# Patient Record
Sex: Male | Born: 1995 | Marital: Single | Smoking: Never smoker
Health system: Southern US, Community
[De-identification: ages and names within clinical notes are randomized; demographics above are authoritative.]

## PROBLEM LIST (undated history)

## (undated) DIAGNOSIS — Z789 Other specified health status: Secondary | ICD-10-CM

## (undated) HISTORY — PX: COLONOSCOPY: SHX174

---

## 2016-03-27 ENCOUNTER — Ambulatory Visit (INDEPENDENT_AMBULATORY_CARE_PROVIDER_SITE_OTHER): Payer: Self-pay | Admitting: Podiatry

## 2016-03-27 ENCOUNTER — Encounter: Payer: Self-pay | Admitting: Podiatry

## 2016-03-27 VITALS — BP 129/95 | HR 54 | Resp 16 | Ht 69.0 in | Wt 290.0 lb

## 2016-03-27 DIAGNOSIS — L600 Ingrowing nail: Secondary | ICD-10-CM

## 2016-03-27 MED ORDER — NEOMYCIN-POLYMYXIN-HC 3.5-10000-1 OT SOLN: OTIC | 0 refills | Status: DC

## 2016-03-27 NOTE — Progress Notes (Signed)
   Subjective:    Patient ID: William Flynn, male    DOB: 1995/07/19, 21 y.o.   MRN: [id number]  HPI: [name] presents today with chief complaint of painful ingrown toenails to the fibular borders of the hallux bilateral times the past 2 months. He is a Leisure centre managermath student at [employer]    Review of Systems  All other systems reviewed and are negative.      Objective:   Physical Exam: Also stable alert and oriented 3. Pulses are palpable. Neurologic sensorium is intact. Degenerative flexors are intact muscle strength is normal bilateral. Orthopedic evaluation of strength all joints distal to the ankle for range of motion without crepitation. Cutaneous evaluation of his joints supple well-hydrated cutis no erythema edema cellulitis drainage or odor. Sharp innervated nail margins along the fibular borders.        Assessment & Plan:  Ingrown nail paronychia fibular border hallux bilateral.  Plan: Chemical matrixectomy performed today to the fibular border of the hallux bilateral after local anesthesia was achieved. He tolerated the procedure well without complications. He tolerated procedure well without complications he was provided with both oral and written home going instructions for care and soaking of his toe also provided him with a prescription for Cortisporin Otic to be applied twice daily after soaking. I will follow-up with him in 2 weeks.

## 2016-03-27 NOTE — Patient Instructions (Signed)

## 2016-04-17 ENCOUNTER — Ambulatory Visit (INDEPENDENT_AMBULATORY_CARE_PROVIDER_SITE_OTHER): Payer: Self-pay | Admitting: Podiatry

## 2016-04-17 DIAGNOSIS — L600 Ingrowing nail: Secondary | ICD-10-CM

## 2016-04-17 NOTE — Progress Notes (Signed)
He presents today for follow-up of his matrixectomy is hallux bilateral. He states that 2-3 days after the procedure they were exquisitely painful but now they're doing much better. He states they feel much better now than he did prior to surgery. He states that he has not been soaking over the last few days.  Objective: Vital signs are stable he is alert and oriented 3 there's some mild drainage with some mild erythema proximal lateral nail fold of the hallux left and hallux right appears to be healing uneventfully.  Assessment: Mild postoperative paronychia hallux left.  Plan: I encouraged him to soak with Epsom salts and warm water twice daily covered in the daytime on Levaquin at bedtime. I will follow-up with him should these worsen. He is to continue to soak until completely resolved and that there is no redness no drainage and no pain on palpation.

## 2017-04-15 ENCOUNTER — Encounter (HOSPITAL_COMMUNITY): Payer: Self-pay

## 2017-04-15 ENCOUNTER — Emergency Department (HOSPITAL_COMMUNITY): Payer: BLUE CROSS/BLUE SHIELD

## 2017-04-15 ENCOUNTER — Encounter (HOSPITAL_COMMUNITY)
Admission: EM | Disposition: A | Discharge status: Home / Self Care | Payer: Self-pay | Source: Home / Self Care | Attending: Emergency Medicine

## 2017-04-15 ENCOUNTER — Other Ambulatory Visit: Payer: Self-pay

## 2017-04-15 ENCOUNTER — Emergency Department (HOSPITAL_COMMUNITY): Payer: BLUE CROSS/BLUE SHIELD | Admitting: Certified Registered"

## 2017-04-15 ENCOUNTER — Observation Stay (HOSPITAL_COMMUNITY)
Admission: EM | Admit: 2017-04-15 | Discharge: 2017-04-16 | Disposition: A | Discharge status: Home / Self Care | Payer: BLUE CROSS/BLUE SHIELD | Attending: General Surgery | Admitting: General Surgery

## 2017-04-15 DIAGNOSIS — K3533 Acute appendicitis with perforation and localized peritonitis, with abscess: Secondary | ICD-10-CM | POA: Diagnosis not present

## 2017-04-15 DIAGNOSIS — Z9049 Acquired absence of other specified parts of digestive tract: Secondary | ICD-10-CM

## 2017-04-15 DIAGNOSIS — K760 Fatty (change of) liver, not elsewhere classified: Secondary | ICD-10-CM | POA: Insufficient documentation

## 2017-04-15 DIAGNOSIS — Z6841 Body Mass Index (BMI) 40.0 and over, adult: Secondary | ICD-10-CM | POA: Diagnosis not present

## 2017-04-15 DIAGNOSIS — K3580 Unspecified acute appendicitis: Secondary | ICD-10-CM

## 2017-04-15 DIAGNOSIS — R7981 Abnormal blood-gas level: Secondary | ICD-10-CM

## 2017-04-15 HISTORY — PX: APPENDECTOMY: SHX54

## 2017-04-15 HISTORY — DX: Other specified health status: Z78.9

## 2017-04-15 HISTORY — PX: LAPAROSCOPIC APPENDECTOMY: SHX408

## 2017-04-15 LAB — URINALYSIS, ROUTINE W REFLEX MICROSCOPIC
BILIRUBIN URINE: NEGATIVE
Bacteria, UA: NONE SEEN
GLUCOSE, UA: NEGATIVE mg/dL
KETONES UR: NEGATIVE mg/dL
LEUKOCYTES UA: NEGATIVE
NITRITE: NEGATIVE
PROTEIN: 30 mg/dL — AB
SQUAMOUS EPITHELIAL / LPF: NONE SEEN
Specific Gravity, Urine: >1.046 — ABNORMAL HIGH (ref 1.005–1.030)
pH: 6 (ref 5.0–8.0)

## 2017-04-15 LAB — COMPREHENSIVE METABOLIC PANEL
ALT: 43 U/L (ref 17–63)
ANION GAP: 14 (ref 5–15)
AST: 16 U/L (ref 15–41)
Albumin: 4.4 g/dL (ref 3.5–5.0)
Alkaline Phosphatase: 56 U/L (ref 38–126)
BILIRUBIN TOTAL: 3.6 mg/dL — AB (ref 0.3–1.2)
BUN: 12 mg/dL (ref 6–20)
CO2: 25 mmol/L (ref 22–32)
Calcium: 9.3 mg/dL (ref 8.9–10.3)
Chloride: 98 mmol/L — ABNORMAL LOW (ref 101–111)
Creatinine, Ser: 1.34 mg/dL — ABNORMAL HIGH (ref 0.61–1.24)
GFR calc Af Amer: >60 mL/min (ref >60)
Glucose, Bld: 126 mg/dL — ABNORMAL HIGH (ref 65–99)
Potassium: 3.3 mmol/L — ABNORMAL LOW (ref 3.5–5.1)
Sodium: 137 mmol/L (ref 135–145)
TOTAL PROTEIN: 8 g/dL (ref 6.5–8.1)

## 2017-04-15 LAB — CBC WITH DIFFERENTIAL/PLATELET
BASOS ABS: 0.1 10*3/uL (ref 0.0–0.1)
Basophils Relative: 0 %
EOS PCT: 0 %
Eosinophils Absolute: 0.1 10*3/uL (ref 0.0–0.7)
HCT: 46.1 % (ref 39.0–52.0)
Hemoglobin: 16.6 g/dL (ref 13.0–17.0)
LYMPHS PCT: 16 %
Lymphs Abs: 2.9 10*3/uL (ref 0.7–4.0)
MCH: 33.6 pg (ref 26.0–34.0)
MCHC: 36 g/dL (ref 30.0–36.0)
MCV: 93.3 fL (ref 78.0–100.0)
Monocytes Absolute: 1.5 10*3/uL — ABNORMAL HIGH (ref 0.1–1.0)
Monocytes Relative: 8 %
Neutro Abs: 14.2 10*3/uL — ABNORMAL HIGH (ref 1.7–7.7)
Neutrophils Relative %: 76 %
PLATELETS: 217 10*3/uL (ref 150–400)
RBC: 4.94 MIL/uL (ref 4.22–5.81)
RDW: 12.5 % (ref 11.5–15.5)
WBC: 18.7 10*3/uL — AB (ref 4.0–10.5)

## 2017-04-15 LAB — LIPASE, BLOOD: LIPASE: 40 U/L (ref 11–51)

## 2017-04-15 LAB — I-STAT CG4 LACTIC ACID, ED: Lactic Acid, Venous: 1.74 mmol/L (ref 0.5–1.9)

## 2017-04-15 SURGERY — APPENDECTOMY, LAPAROSCOPIC
Anesthesia: General

## 2017-04-15 MED ORDER — DEXAMETHASONE SODIUM PHOSPHATE 10 MG/ML IJ SOLN
INTRAMUSCULAR | Status: DC | PRN
  Administered 2017-04-15: 10 mg via INTRAVENOUS

## 2017-04-15 MED ORDER — SUGAMMADEX SODIUM 500 MG/5ML IV SOLN
INTRAVENOUS | Status: AC
  Filled 2017-04-15: qty 5

## 2017-04-15 MED ORDER — BUPIVACAINE HCL (PF) 0.5 % IJ SOLN
INTRAMUSCULAR | Status: AC
  Filled 2017-04-15: qty 30

## 2017-04-15 MED ORDER — ROCURONIUM BROMIDE 10 MG/ML (PF) SYRINGE
PREFILLED_SYRINGE | INTRAVENOUS | Status: DC | PRN
  Administered 2017-04-15: 50 mg via INTRAVENOUS

## 2017-04-15 MED ORDER — MORPHINE SULFATE (PF) 4 MG/ML IV SOLN
4.0000 mg | Freq: Once | INTRAVENOUS | Status: AC
  Administered 2017-04-15: 4 mg via INTRAVENOUS
  Filled 2017-04-15: qty 1

## 2017-04-15 MED ORDER — 0.9 % SODIUM CHLORIDE (POUR BTL) OPTIME
TOPICAL | Status: DC | PRN
  Administered 2017-04-15: 1000 mL

## 2017-04-15 MED ORDER — FENTANYL CITRATE (PF) 250 MCG/5ML IJ SOLN
INTRAMUSCULAR | Status: DC | PRN
  Administered 2017-04-15: 150 ug via INTRAVENOUS
  Administered 2017-04-15: 100 ug via INTRAVENOUS

## 2017-04-15 MED ORDER — SUGAMMADEX SODIUM 500 MG/5ML IV SOLN
INTRAVENOUS | Status: DC | PRN
  Administered 2017-04-15: 550 mg via INTRAVENOUS

## 2017-04-15 MED ORDER — ONDANSETRON HCL 4 MG/2ML IJ SOLN
INTRAMUSCULAR | Status: DC | PRN
  Administered 2017-04-15: 4 mg via INTRAVENOUS

## 2017-04-15 MED ORDER — SUCCINYLCHOLINE CHLORIDE 20 MG/ML IJ SOLN
INTRAMUSCULAR | Status: AC
  Filled 2017-04-15: qty 3

## 2017-04-15 MED ORDER — SODIUM CHLORIDE 0.9 % IV BOLUS (SEPSIS)
1000.0000 mL | Freq: Once | INTRAVENOUS | Status: AC
  Administered 2017-04-15: 1000 mL via INTRAVENOUS

## 2017-04-15 MED ORDER — PROPOFOL 10 MG/ML IV BOLUS
INTRAVENOUS | Status: DC | PRN
  Administered 2017-04-15: 200 mg via INTRAVENOUS

## 2017-04-15 MED ORDER — ONDANSETRON 4 MG PO TBDP
8.0000 mg | ORAL_TABLET | Freq: Once | ORAL | Status: AC
Start: 2017-04-15 — End: 2017-04-15
  Administered 2017-04-15: 8 mg via ORAL
  Filled 2017-04-15: qty 2

## 2017-04-15 MED ORDER — MIDAZOLAM HCL 2 MG/2ML IJ SOLN
INTRAMUSCULAR | Status: AC
  Filled 2017-04-15: qty 2

## 2017-04-15 MED ORDER — BUPIVACAINE-EPINEPHRINE 0.25% -1:200000 IJ SOLN
INTRAMUSCULAR | Status: AC
  Filled 2017-04-15: qty 1

## 2017-04-15 MED ORDER — SUGAMMADEX SODIUM 200 MG/2ML IV SOLN
INTRAVENOUS | Status: AC
  Filled 2017-04-15: qty 2

## 2017-04-15 MED ORDER — LACTATED RINGERS IV SOLN
INTRAVENOUS | Status: DC | PRN
  Administered 2017-04-15 (×2): via INTRAVENOUS

## 2017-04-15 MED ORDER — PROMETHAZINE HCL 25 MG/ML IJ SOLN: 6.2500 mg | INTRAMUSCULAR | Status: DC | PRN

## 2017-04-15 MED ORDER — HYDROMORPHONE HCL 1 MG/ML IJ SOLN
0.2500 mg | INTRAMUSCULAR | Status: DC | PRN
  Administered 2017-04-16 (×2): 0.5 mg via INTRAVENOUS

## 2017-04-15 MED ORDER — LIDOCAINE HCL (CARDIAC) 20 MG/ML IV SOLN
INTRAVENOUS | Status: AC
  Filled 2017-04-15: qty 5

## 2017-04-15 MED ORDER — METRONIDAZOLE IN NACL 5-0.79 MG/ML-% IV SOLN
500.0000 mg | Freq: Once | INTRAVENOUS | Status: AC
  Administered 2017-04-15: 500 mg via INTRAVENOUS
  Filled 2017-04-15: qty 100

## 2017-04-15 MED ORDER — IOPAMIDOL (ISOVUE-300) INJECTION 61%
INTRAVENOUS | Status: AC
  Administered 2017-04-15: 100 mL via INTRAVENOUS
  Filled 2017-04-15: qty 100

## 2017-04-15 MED ORDER — BUPIVACAINE HCL (PF) 0.25 % IJ SOLN
INTRAMUSCULAR | Status: AC
  Filled 2017-04-15: qty 10

## 2017-04-15 MED ORDER — SODIUM CHLORIDE 0.9 % IR SOLN
Status: DC | PRN
  Administered 2017-04-15: 1000 mL

## 2017-04-15 MED ORDER — MIDAZOLAM HCL 5 MG/5ML IJ SOLN
INTRAMUSCULAR | Status: DC | PRN
  Administered 2017-04-15: 2 mg via INTRAVENOUS

## 2017-04-15 MED ORDER — FENTANYL CITRATE (PF) 250 MCG/5ML IJ SOLN
INTRAMUSCULAR | Status: AC
  Filled 2017-04-15: qty 5

## 2017-04-15 MED ORDER — LIDOCAINE 2% (20 MG/ML) 5 ML SYRINGE
INTRAMUSCULAR | Status: DC | PRN
  Administered 2017-04-15: 60 mg via INTRAVENOUS

## 2017-04-15 MED ORDER — PROPOFOL 10 MG/ML IV BOLUS
INTRAVENOUS | Status: AC
  Filled 2017-04-15: qty 20

## 2017-04-15 MED ORDER — SUCCINYLCHOLINE 20MG/ML (10ML) SYRINGE FOR MEDFUSION PUMP - OPTIME
INTRAMUSCULAR | Status: DC | PRN
  Administered 2017-04-15: 160 mg via INTRAVENOUS

## 2017-04-15 MED ORDER — SODIUM CHLORIDE 0.9 % IV SOLN
2.0000 g | Freq: Once | INTRAVENOUS | Status: AC
  Administered 2017-04-15: 2 g via INTRAVENOUS
  Filled 2017-04-15: qty 20

## 2017-04-15 MED ORDER — BUPIVACAINE HCL (PF) 0.25 % IJ SOLN
INTRAMUSCULAR | Status: DC | PRN
  Administered 2017-04-15: 10 mL

## 2017-04-15 SURGICAL SUPPLY — 41 items
APPLIER CLIP 5 13 M/L LIGAMAX5 (MISCELLANEOUS)
BENZOIN TINCTURE PRP APPL 2/3 (GAUZE/BANDAGES/DRESSINGS) ×2 IMPLANT
BLADE CLIPPER SURG (BLADE) IMPLANT
CANISTER SUCT 3000ML PPV (MISCELLANEOUS) ×2 IMPLANT
CHLORAPREP W/TINT 26ML (MISCELLANEOUS) ×2 IMPLANT
CLIP APPLIE 5 13 M/L LIGAMAX5 (MISCELLANEOUS) IMPLANT
COVER SURGICAL LIGHT HANDLE (MISCELLANEOUS) ×2 IMPLANT
COVER TRANSDUCER ULTRASND (DRAPES) ×2 IMPLANT
DERMABOND ADVANCED (GAUZE/BANDAGES/DRESSINGS) ×1
DERMABOND ADVANCED .7 DNX12 (GAUZE/BANDAGES/DRESSINGS) ×1 IMPLANT
ELECT REM PT RETURN 9FT ADLT (ELECTROSURGICAL) ×2
ELECTRODE REM PT RTRN 9FT ADLT (ELECTROSURGICAL) ×1 IMPLANT
ENDOLOOP SUT PDS II  0 18 (SUTURE) ×3
ENDOLOOP SUT PDS II 0 18 (SUTURE) ×3 IMPLANT
GAUZE SPONGE 2X2 8PLY STRL LF (GAUZE/BANDAGES/DRESSINGS) ×1 IMPLANT
GLOVE BIO SURGEON STRL SZ7.5 (GLOVE) ×2 IMPLANT
GOWN STRL REUS W/ TWL LRG LVL3 (GOWN DISPOSABLE) ×2 IMPLANT
GOWN STRL REUS W/ TWL XL LVL3 (GOWN DISPOSABLE) ×1 IMPLANT
GOWN STRL REUS W/TWL LRG LVL3 (GOWN DISPOSABLE) ×2
GOWN STRL REUS W/TWL XL LVL3 (GOWN DISPOSABLE) ×1
GRASPER SUT TROCAR 14GX15 (MISCELLANEOUS) ×2 IMPLANT
KIT BASIN OR (CUSTOM PROCEDURE TRAY) ×2 IMPLANT
KIT ROOM TURNOVER OR (KITS) ×2 IMPLANT
NEEDLE INSUFFLATION 14GA 120MM (NEEDLE) ×2 IMPLANT
NS IRRIG 1000ML POUR BTL (IV SOLUTION) ×2 IMPLANT
PAD ARMBOARD 7.5X6 YLW CONV (MISCELLANEOUS) ×4 IMPLANT
SCISSORS LAP 5X35 DISP (ENDOMECHANICALS) ×2 IMPLANT
SET IRRIG TUBING LAPAROSCOPIC (IRRIGATION / IRRIGATOR) ×2 IMPLANT
SLEEVE ENDOPATH XCEL 5M (ENDOMECHANICALS) ×2 IMPLANT
SPECIMEN JAR SMALL (MISCELLANEOUS) ×2 IMPLANT
SPONGE GAUZE 2X2 STER 10/PKG (GAUZE/BANDAGES/DRESSINGS) ×1
STRIP CLOSURE SKIN 1/2X4 (GAUZE/BANDAGES/DRESSINGS) ×2 IMPLANT
SUT MNCRL AB 4-0 PS2 18 (SUTURE) ×2 IMPLANT
TOWEL OR 17X24 6PK STRL BLUE (TOWEL DISPOSABLE) ×2 IMPLANT
TOWEL OR 17X26 10 PK STRL BLUE (TOWEL DISPOSABLE) ×2 IMPLANT
TRAY FOLEY CATH SILVER 16FR (SET/KITS/TRAYS/PACK) ×2 IMPLANT
TRAY LAPAROSCOPIC MC (CUSTOM PROCEDURE TRAY) ×2 IMPLANT
TROCAR XCEL NON-BLD 11X100MML (ENDOMECHANICALS) ×2 IMPLANT
TROCAR XCEL NON-BLD 5MMX100MML (ENDOMECHANICALS) ×2 IMPLANT
TUBING INSUFFLATION (TUBING) ×2 IMPLANT
WATER STERILE IRR 1000ML POUR (IV SOLUTION) ×2 IMPLANT

## 2017-04-15 NOTE — ED Provider Notes (Signed)
Patient placed in Quick Look pathway, seen and evaluated   Chief Complaint: nausea, vomiting, abdominal pain  HPI:   Pt with abdominal pain, nasuea, vomiting, right sided abdominal pain. No fever. No urinary symptoms. Tried taking pepto and ibuprofen with no relief. No hx of abdominal surgeries. Went to PCP, WBC is 20.9, sent here.   ROS: positive for abd pain, nausea, vomiting. Negative for urinary symptoms, scrotal pain  Physical Exam:   Gen: No distress  Neuro: Awake and Alert  Skin: Warm    Focused Exam: tachycardic, regular HR and rhythm. Lungs clear. Abdomen diffusely tender with most tenderness in RLQ, with some guarding.   Pt with abdominal pain, nausea, vomiting, RLQ pain with guarding. Concerning for appendicitis. Will get labs, CT abd/pelvis for further evaluation. Pt is tachycardic, will need fluids once in the room.    Vitals:   04/15/17 1729 04/15/17 1730  BP: (!) 135/92   Pulse: (!) 120   Resp: 18   Temp: 99.3 F (37.4 C)   TempSrc: Oral   SpO2: 96%   Weight:  130.6 kg (288 lb)      Initiation of care has begun. The patient has been counseled on the process, plan, and necessity for staying for the completion/evaluation, and the remainder of the medical screening examination    Iona CoachKirichenko, Berklee Battey, PA-C 04/15/17 1742    Azalia Bilisampos, Kevin, MD 04/15/17 2252

## 2017-04-15 NOTE — H&P (Signed)
William Flynn is an 22 y.o. male.   Chief Complaint: Abdominal pain HPI: Patient is a 22 year old male with a 2-day history of periumbilical abdominal pain which localized to the right lower quadrant.  Patient states he had continued abdominal pain, nausea, vomiting, diarrhea.  Patient states that been anorexic over the last 24 hours.  Secondary to continued pain he presented to the ER for further evaluation and management.  Patient underwent CT scan which was consistent with appendicitis, likely appendicolith, phlegmon.  I reviewed this CT scan personally.  Laboratory studies reveal an elevated WBC count and elevated creatinine.  General surgery was consulted for further evaluation and management.  History reviewed. No pertinent past medical history.  History reviewed. No pertinent surgical history.  No family history on file. Social History:  reports that  has never smoked. he has never used smokeless tobacco. His alcohol and drug histories are not on file.  Allergies: No Known Allergies   (Not in a hospital admission)  Results for orders placed or performed during the hospital encounter of 04/15/17 (from the past 48 hour(s))  Comprehensive metabolic panel     Status: Abnormal   Collection Time: 04/15/17  5:31 PM  Result Value Ref Range   Sodium 137 135 - 145 mmol/L   Potassium 3.3 (L) 3.5 - 5.1 mmol/L   Chloride 98 (L) 101 - 111 mmol/L   CO2 25 22 - 32 mmol/L   Glucose, Bld 126 (H) 65 - 99 mg/dL   BUN 12 6 - 20 mg/dL   Creatinine, Ser 1.34 (H) 0.61 - 1.24 mg/dL   Calcium 9.3 8.9 - 10.3 mg/dL   Total Protein 8.0 6.5 - 8.1 g/dL   Albumin 4.4 3.5 - 5.0 g/dL   AST 16 15 - 41 U/L   ALT 43 17 - 63 U/L   Alkaline Phosphatase 56 38 - 126 U/L   Total Bilirubin 3.6 (H) 0.3 - 1.2 mg/dL   GFR calc non Af Amer >60 >60 mL/min   GFR calc Af Amer >60 >60 mL/min    Comment: (NOTE) The eGFR has been calculated using the CKD EPI equation. This calculation has not been validated in all  clinical situations. eGFR's persistently <60 mL/min signify possible Chronic Kidney Disease.    Anion gap 14 5 - 15    Comment: Performed at [facility] [address] [place], [place] [address]  CBC with Differential     Status: Abnormal   Collection Time: 04/15/17  5:31 PM  Result Value Ref Range   WBC 18.7 (H) 4.0 - 10.5 K/uL   RBC 4.94 4.22 - 5.81 MIL/uL   Hemoglobin 16.6 13.0 - 17.0 g/dL   HCT 46.1 39.0 - 52.0 %   MCV 93.3 78.0 - 100.0 fL   MCH 33.6 26.0 - 34.0 pg   MCHC 36.0 30.0 - 36.0 g/dL   RDW 12.5 11.5 - 15.5 %   Platelets 217 150 - 400 K/uL   Neutrophils Relative % 76 %   Neutro Abs 14.2 (H) 1.7 - 7.7 K/uL   Lymphocytes Relative 16 %   Lymphs Abs 2.9 0.7 - 4.0 K/uL   Monocytes Relative 8 %   Monocytes Absolute 1.5 (H) 0.1 - 1.0 K/uL   Eosinophils Relative 0 %   Eosinophils Absolute 0.1 0.0 - 0.7 K/uL   Basophils Relative 0 %   Basophils Absolute 0.1 0.0 - 0.1 K/uL    Comment: Performed at [facility] [address],  [place], [place] [address]  Lipase, blood     Status: None   Collection Time: 04/15/17  5:31 PM  Result Value Ref Range   Lipase 40 11 - 51 U/L    Comment: Performed at [facility], [place] [address], [place], [place] [address]  I-Stat CG4 Lactic Acid, ED     Status: None   Collection Time: 04/15/17  6:05 PM  Result Value Ref Range   Lactic Acid, Venous 1.74 0.5 - 1.9 mmol/L  Urinalysis, Routine w reflex microscopic     Status: Abnormal   Collection Time: 04/15/17  8:28 PM  Result Value Ref Range   Color, Urine YELLOW YELLOW   APPearance CLEAR CLEAR   Specific Gravity, Urine >1.046 (H) 1.005 - 1.030   pH 6.0 5.0 - 8.0   Glucose, UA NEGATIVE NEGATIVE mg/dL   Hgb urine dipstick SMALL (A) NEGATIVE   Bilirubin Urine NEGATIVE NEGATIVE   Ketones, ur NEGATIVE NEGATIVE mg/dL   Protein, ur 30 (A) NEGATIVE mg/dL   Nitrite NEGATIVE NEGATIVE   Leukocytes, UA NEGATIVE NEGATIVE   RBC / HPF 0-5 0 - 5 RBC/hpf   WBC, UA 0-5 0 - 5  WBC/hpf   Bacteria, UA NONE SEEN NONE SEEN   Squamous Epithelial / LPF NONE SEEN NONE SEEN    Comment: Performed at [facility], [place] [address], [place], [place] [address]   Ct Abdomen Pelvis W Contrast  Result Date: 04/15/2017 CLINICAL DATA:  22 year old male with history of nausea, vomiting and right-sided abdominal pain. EXAM: CT ABDOMEN AND PELVIS WITH CONTRAST TECHNIQUE: Multidetector CT imaging of the abdomen and pelvis was performed using the standard protocol following bolus administration of intravenous contrast. CONTRAST:  138m ISOVUE-300 IOPAMIDOL (ISOVUE-300) INJECTION 61% COMPARISON:  None. FINDINGS: Lower chest: Unremarkable. Hepatobiliary: Diffuse low attenuation throughout the hepatic parenchyma, compatible with hepatic steatosis. No suspicious cystic or solid hepatic lesions. No intra or extrahepatic biliary ductal dilatation. Gallbladder is normal in appearance. Pancreas: No pancreatic mass. No pancreatic ductal dilatation. No pancreatic or peripancreatic fluid or inflammatory changes. Spleen: Unremarkable. Adrenals/Urinary Tract: Bilateral kidneys and bilateral adrenal glands are normal in appearance. No hydroureteronephrosis. Urinary bladder is normal in appearance. Stomach/Bowel: Normal appearance of the stomach. No pathologic dilatation of small bowel or colon. Appendix is markedly dilated (18 mm in diameter) in demonstrates extensive surrounding inflammatory changes. There is a small amount of high attenuation in the proximal appendix (axial image 68 of series 3), but it is uncertain whether not this is some oral contrast material or a small appendicoliths. Oral contrast material is present in the cecum and colon. Appendix: Location: Medial and inferior to the cecum Diameter: 18 mm Appendicolith: Possibly Mucosal hyper-enhancement: Yes Extraluminal gas: None Periappendiceal collection: Phlegmon but no abscess Vascular/Lymphatic: No significant atherosclerotic disease, aneurysm  or dissection noted in the abdominal or pelvic vasculature. No lymphadenopathy noted in the abdomen or pelvis. Reproductive: Prostate gland and seminal vesicles are unremarkable in appearance. Other: No significant volume of ascites.  No pneumoperitoneum. Musculoskeletal: There are no aggressive appearing lytic or blastic lesions noted in the visualized portions of the skeleton. IMPRESSION: 1. Findings are compatible with severe acute appendicitis. Periappendiceal phlegmon noted, without definite periappendiceal abscess or signs of frank perforation at this time. Surgical consultation is strongly recommended. 2. Hepatic steatosis. Critical Value/emergent results were called by telephone at the time of interpretation on 04/15/2017 at 8:11 pm to Dr. TJeannett Senior who verbally acknowledged these results. Electronically Signed   By: DQuillian Quince  Entrikin M.D.   On: 04/15/2017 20:21    Review of Systems  Constitutional: Negative for chills, fever and malaise/fatigue.  HENT: Negative for ear discharge, hearing loss and sore throat.   Eyes: Negative for blurred vision and discharge.  Respiratory: Negative for cough and shortness of breath.   Cardiovascular: Negative for chest pain, orthopnea and leg swelling.  Gastrointestinal: Positive for abdominal pain, diarrhea, nausea and vomiting. Negative for constipation and heartburn.  Musculoskeletal: Negative for myalgias and neck pain.  Skin: Negative for itching and rash.  Neurological: Negative for dizziness, focal weakness, seizures and loss of consciousness.  Endo/Heme/Allergies: Negative for environmental allergies. Does not bruise/bleed easily.  Psychiatric/Behavioral: Negative for depression and suicidal ideas.  All other systems reviewed and are negative.   Blood pressure (!) 110/55, pulse (!) 104, temperature 99.3 F (37.4 C), temperature source Oral, resp. rate 18, weight 130.6 kg (288 lb), SpO2 97 %. Physical Exam  Constitutional: He is oriented  to person, place, and time. Vital signs are normal. He appears well-developed and well-nourished.  Conversant No acute distress  Eyes: Lids are normal. No scleral icterus.  No lid lag Moist conjunctiva  Neck: No tracheal tenderness present. No thyromegaly present.  No cervical lymphadenopathy  Cardiovascular: Normal rate, regular rhythm and intact distal pulses.  No murmur heard. Respiratory: Effort normal and breath sounds normal. He has no wheezes. He has no rales.  GI: Soft. There is no hepatosplenomegaly. There is tenderness. There is tenderness at McBurney's point. There is no rigidity, no rebound and no guarding. No hernia.  Neurological: He is alert and oriented to person, place, and time.  Normal gait and station  Skin: Skin is warm. No rash noted. No cyanosis. Nails show no clubbing.  Normal skin turgor  Psychiatric: Judgment normal.  Appropriate affect     Assessment/Plan 22 year old male with acute appendicitis  1.  IV fluids, IV antibiotics 2.  We will proceed to the operating for laparoscopic appendectomy 3.I discussed with the patient the risks benefits of the procedure to include but not limited to: Infection, bleeding, damage to surrounding structures, possible ileus, possible postoperative infection. Patient voiced understanding and wishes to proceed.   Reyes Ivan, MD 04/15/2017, 9:56 PM

## 2017-04-15 NOTE — Anesthesia Preprocedure Evaluation (Signed)
Anesthesia Evaluation  Patient identified by MRN, date of birth, ID band Patient awake    Reviewed: Allergy & Precautions, NPO status , Patient's Chart, lab work & pertinent test results  Airway Mallampati: III  TM Distance: >3 FB Neck ROM: Full    Dental  (+) Dental Advisory Given   Pulmonary neg pulmonary ROS,    breath sounds clear to auscultation       Cardiovascular negative cardio ROS   Rhythm:Regular Rate:Normal     Neuro/Psych negative neurological ROS     GI/Hepatic Neg liver ROS, Acute appendicitis   Endo/Other  Morbid obesity  Renal/GU Renal InsufficiencyRenal disease     Musculoskeletal   Abdominal   Peds  Hematology negative hematology ROS (+)   Anesthesia Other Findings   Reproductive/Obstetrics                             Lab Results  Component Value Date   WBC 18.7 (H) 04/15/2017   HGB 16.6 04/15/2017   HCT 46.1 04/15/2017   MCV 93.3 04/15/2017   PLT 217 04/15/2017   Lab Results  Component Value Date   CREATININE 1.34 (H) 04/15/2017   BUN 12 04/15/2017   NA 137 04/15/2017   K 3.3 (L) 04/15/2017   CL 98 (L) 04/15/2017   CO2 25 04/15/2017    Anesthesia Physical Anesthesia Plan  ASA: II and emergent  Anesthesia Plan: General   Post-op Pain Management:    Induction: Intravenous and Rapid sequence  PONV Risk Score and Plan: 3 and Midazolam, Dexamethasone, Ondansetron and Treatment may vary due to age or medical condition  Airway Management Planned: Oral ETT  Additional Equipment:   Intra-op Plan:   Post-operative Plan: Extubation in OR  Informed Consent: I have reviewed the patients History and Physical, chart, labs and discussed the procedure including the risks, benefits and alternatives for the proposed anesthesia with the patient or authorized representative who has indicated his/her understanding and acceptance.   Dental advisory given  Plan  Discussed with: CRNA  Anesthesia Plan Comments:         Anesthesia Quick Evaluation

## 2017-04-15 NOTE — ED Provider Notes (Signed)
[facility] EMERGENCY DEPARTMENT Provider Note   CSN: [id number] Arrival date & time: 04/15/17  1709     History   Chief Complaint Chief Complaint  Patient presents with  . Abdominal Pain    HPI William Flynn is a 22 y.o. male.  HPI  Mr. Maroney is a 22 year old male with no sleeping past medical history who presents to the emergency department for evaluation of right lower quadrant pain, nausea/vomiting and diarrhea.  Patient reports that his symptoms began 2 nights ago.  At first his pain was located in the periumbilical area but yesterday it moved to the right lower quadrant.  He states that his abdominal pain is 4/10 in severity, but becomes 10/10 in severity with any type of movement of the torso.  Reports that the pain feels sharp and stabbing.  He endorses tactile fever with chills at home.  He had several episodes of nonbloody emesis yesterday.  Also reports 2 episodes of diarrhea yesterday.  He last ate yesterday evening.  Denies previous abdominal surgeries.  Denies headache, chest pain, shortness of breath, cough, melena, hematochezia, dysuria, urinary frequency, testicular pain or scrotal swelling.  History reviewed. No pertinent past medical history.  There are no active problems to display for this patient.   History reviewed. No pertinent surgical history.     Home Medications    Prior to Admission medications   Medication Sig Start Date End Date Taking? Authorizing Provider  neomycin-polymyxin-hydrocortisone (CORTISPORIN) otic solution Apply one to two drops to toe after soaking twice daily. 03/27/16   Elinor Parkinson, DPM    Family History No family history on file.  Social History Social History   Tobacco Use  . Smoking status: Never Smoker  . Smokeless tobacco: Never Used  Substance Use Topics  . Alcohol use: Not on file  . Drug use: Not on file     Allergies   Patient has no known allergies.   Review of Systems Review of  Systems  Constitutional: Positive for appetite change (low appetite), chills and fever.  HENT: Negative for congestion and sore throat.   Eyes: Negative for visual disturbance.  Respiratory: Negative for shortness of breath.   Cardiovascular: Negative for chest pain.  Gastrointestinal: Positive for abdominal distention (RLQ), diarrhea, nausea and vomiting. Negative for blood in stool and constipation.  Genitourinary: Negative for difficulty urinating, dysuria, flank pain, frequency and hematuria.  Musculoskeletal: Negative for gait problem.  Skin: Negative for rash.  Neurological: Negative for headaches.  Psychiatric/Behavioral: Negative for agitation.     Physical Exam Updated Vital Signs BP 130/77   Pulse (!) 110   Temp 99.3 F (37.4 C) (Oral)   Resp 18   Wt 130.6 kg (288 lb)   SpO2 97%   BMI 42.53 kg/m   Physical Exam  Constitutional: He is oriented to person, place, and time. He appears well-developed and well-nourished. No distress.  Sitting bedside in no apparent distress.  Nontoxic.  HENT:  Head: Normocephalic and atraumatic.  Mouth/Throat: Oropharynx is clear and moist. No oropharyngeal exudate.  Mucous memories moist.  Eyes: Conjunctivae are normal. Pupils are equal, round, and reactive to light. Right eye exhibits no discharge. Left eye exhibits no discharge.  Neck: Normal range of motion. Neck supple.  Cardiovascular: Normal rate, regular rhythm and intact distal pulses. Exam reveals no friction rub.  No murmur heard. Pulmonary/Chest: Effort normal and breath sounds normal. No stridor. No respiratory distress. He has no wheezes. He has no rales.  Abdominal: Soft.  Abdomen soft.  Acutely tender to palpation in the right lower quadrant with guarding.  No rebound tenderness.  Positive McBurney's point.    Musculoskeletal: Normal range of motion.  Neurological: He is alert and oriented to person, place, and time. Coordination normal.  Skin: Skin is warm and dry.  Capillary refill takes less than 2 seconds. He is not diaphoretic.  Psychiatric: He has a normal mood and affect. His behavior is normal.  Nursing note and vitals reviewed.   ED Treatments / Results  Labs (all labs ordered are listed, but only abnormal results are displayed) Labs Reviewed  COMPREHENSIVE METABOLIC PANEL - Abnormal; Notable for the following components:      Result Value   Potassium 3.3 (*)    Chloride 98 (*)    Glucose, Bld 126 (*)    Creatinine, Ser 1.34 (*)    Total Bilirubin 3.6 (*)    All other components within normal limits  CBC WITH DIFFERENTIAL/PLATELET - Abnormal; Notable for the following components:   WBC 18.7 (*)    Neutro Abs 14.2 (*)    Monocytes Absolute 1.5 (*)    All other components within normal limits  URINALYSIS, ROUTINE W REFLEX MICROSCOPIC - Abnormal; Notable for the following components:   Specific Gravity, Urine >1.046 (*)    Hgb urine dipstick SMALL (*)    Protein, ur 30 (*)    All other components within normal limits  LIPASE, BLOOD  I-STAT CG4 LACTIC ACID, ED  I-STAT CG4 LACTIC ACID, ED    EKG  EKG Interpretation None       Radiology Ct Abdomen Pelvis W Contrast  Result Date: 04/15/2017 CLINICAL DATA:  22 year old male with history of nausea, vomiting and right-sided abdominal pain. EXAM: CT ABDOMEN AND PELVIS WITH CONTRAST TECHNIQUE: Multidetector CT imaging of the abdomen and pelvis was performed using the standard protocol following bolus administration of intravenous contrast. CONTRAST:  ISOVUE-300 IOPAMIDOL (ISOVUE-300) INJECTION 61% COMPARISON:  None. FINDINGS: Lower chest: Unremarkable. Hepatobiliary: Diffuse low attenuation throughout the hepatic parenchyma, compatible with hepatic steatosis. No suspicious cystic or solid hepatic lesions. No intra or extrahepatic biliary ductal dilatation. Gallbladder is normal in appearance. Pancreas: No pancreatic mass. No pancreatic ductal dilatation. No pancreatic or  peripancreatic fluid or inflammatory changes. Spleen: Unremarkable. Adrenals/Urinary Tract: Bilateral kidneys and bilateral adrenal glands are normal in appearance. No hydroureteronephrosis. Urinary bladder is normal in appearance. Stomach/Bowel: Normal appearance of the stomach. No pathologic dilatation of small bowel or colon. Appendix is markedly dilated (18 mm in diameter) in demonstrates extensive surrounding inflammatory changes. There is a small amount of high attenuation in the proximal appendix (axial image 68 of series 3), but it is uncertain whether not this is some oral contrast material or a small appendicoliths. Oral contrast material is present in the cecum and colon. Appendix: Location: Medial and inferior to the cecum Diameter: 18 mm Appendicolith: Possibly Mucosal hyper-enhancement: Yes Extraluminal gas: None Periappendiceal collection: Phlegmon but no abscess Vascular/Lymphatic: No significant atherosclerotic disease, aneurysm or dissection noted in the abdominal or pelvic vasculature. No lymphadenopathy noted in the abdomen or pelvis. Reproductive: Prostate gland and seminal vesicles are unremarkable in appearance. Other: No significant volume of ascites.  No pneumoperitoneum. Musculoskeletal: There are no aggressive appearing lytic or blastic lesions noted in the visualized portions of the skeleton. IMPRESSION: 1. Findings are compatible with severe acute appendicitis. Periappendiceal phlegmon noted, without definite periappendiceal abscess or signs of frank perforation at this time. Surgical consultation is strongly recommended.  2. Hepatic steatosis. Critical Value/emergent results were called by telephone at the time of interpretation on 04/15/2017 at 8:11 pm to Dr. Jaynie Crumble, who verbally acknowledged these results. Electronically Signed   By: Trudie Reed M.D.   On: 04/15/2017 20:21    Procedures Procedures (including critical care time)  Medications Ordered in  ED Medications  cefTRIAXone (ROCEPHIN) 2 g in sodium chloride 0.9 % 100 mL IVPB (2 g Intravenous New Bag/Given 04/15/17 2050)    And  metroNIDAZOLE (FLAGYL) IVPB 500 mg (not administered)  morphine 4 MG/ML injection 4 mg (not administered)  sodium chloride 0.9 % bolus 1,000 mL (not administered)  ondansetron (ZOFRAN-ODT) disintegrating tablet 8 mg (8 mg Oral Given 04/15/17 2050)  iopamidol (ISOVUE-300) 61 % injection (100 mLs Intravenous Contrast Given 04/15/17 1942)  sodium chloride 0.9 % bolus 1,000 mL (1,000 mLs Intravenous New Bag/Given 04/15/17 2050)     Initial Impression / Assessment and Plan / ED Course  I have reviewed the triage vital signs and the nursing notes.  Pertinent labs & imaging results that were available during my care of the patient were reviewed by me and considered in my medical decision making (see chart for details).    CT scan reveals severe acute appendicitis with periappendiceal phlegmon, no perforation.  He also has a leukocytosis with WBC 18.7 and shift to the left Neut 14.2.  No lactic acidosis.  Lipase negative.  UA without evidence of infection.  CMP without elevated creatinine of 1.34, no previous to compare to.  Likely elevated due to dehydration in the setting of recent vomiting.  He has a low-grade fever (99.58F) and is tachycardic (110bpm), fluid bolus started.  IV Rocephin and Flagyl ordered.  Pain managed.   Discussed this patient with general surgery Dr. Derrell Lolling who will admit patient for appendectomy.  Final Clinical Impressions(s) / ED Diagnoses   Final diagnoses:  Acute appendicitis, unspecified acute appendicitis type    ED Discharge Orders    None       Kellie Shropshire, PA-C 04/16/17 0100    Pricilla Loveless, MD 04/18/17 [phone]

## 2017-04-15 NOTE — ED Triage Notes (Signed)
Pt presents to the ed with complaints of abdominal pain RLQ x 2 days. He was seen at his doctor and told his WBC is 20.9. Complains of nausea, vomiting and diarrhea.

## 2017-04-16 ENCOUNTER — Observation Stay (HOSPITAL_COMMUNITY): Payer: BLUE CROSS/BLUE SHIELD

## 2017-04-16 ENCOUNTER — Encounter (HOSPITAL_COMMUNITY): Payer: Self-pay

## 2017-04-16 DIAGNOSIS — Z9049 Acquired absence of other specified parts of digestive tract: Secondary | ICD-10-CM

## 2017-04-16 LAB — CBC
HCT: 41 % (ref 39.0–52.0)
HEMOGLOBIN: 14 g/dL (ref 13.0–17.0)
MCH: 32.5 pg (ref 26.0–34.0)
MCHC: 34.1 g/dL (ref 30.0–36.0)
MCV: 95.1 fL (ref 78.0–100.0)
PLATELETS: 191 10*3/uL (ref 150–400)
RBC: 4.31 MIL/uL (ref 4.22–5.81)
RDW: 12.5 % (ref 11.5–15.5)
WBC: 17.1 10*3/uL — AB (ref 4.0–10.5)

## 2017-04-16 LAB — COMPREHENSIVE METABOLIC PANEL
ALBUMIN: 3.2 g/dL — AB (ref 3.5–5.0)
ALK PHOS: 48 U/L (ref 38–126)
ALT: 37 U/L (ref 17–63)
ANION GAP: 11 (ref 5–15)
AST: 28 U/L (ref 15–41)
BUN: 10 mg/dL (ref 6–20)
CALCIUM: 8.4 mg/dL — AB (ref 8.9–10.3)
CO2: 22 mmol/L (ref 22–32)
CREATININE: 1.38 mg/dL — AB (ref 0.61–1.24)
Chloride: 101 mmol/L (ref 101–111)
GFR calc Af Amer: >60 mL/min (ref >60)
GFR calc non Af Amer: >60 mL/min (ref >60)
GLUCOSE: 259 mg/dL — AB (ref 65–99)
Potassium: 3.6 mmol/L (ref 3.5–5.1)
SODIUM: 134 mmol/L — AB (ref 135–145)
Total Bilirubin: 1.9 mg/dL — ABNORMAL HIGH (ref 0.3–1.2)
Total Protein: 6.4 g/dL — ABNORMAL LOW (ref 6.5–8.1)

## 2017-04-16 MED ORDER — METRONIDAZOLE IN NACL 5-0.79 MG/ML-% IV SOLN
500.0000 mg | Freq: Three times a day (TID) | INTRAVENOUS | Status: DC
  Administered 2017-04-16: 500 mg via INTRAVENOUS
  Filled 2017-04-16 (×3): qty 100

## 2017-04-16 MED ORDER — AMOXICILLIN-POT CLAVULANATE 875-125 MG PO TABS: 1.0000 | ORAL_TABLET | Freq: Two times a day (BID) | ORAL | 0 refills | Status: AC

## 2017-04-16 MED ORDER — TRAMADOL HCL 50 MG PO TABS: 50.0000 mg | ORAL_TABLET | Freq: Four times a day (QID) | ORAL | 0 refills | Status: AC | PRN

## 2017-04-16 MED ORDER — GABAPENTIN 300 MG PO CAPS
300.0000 mg | ORAL_CAPSULE | Freq: Two times a day (BID) | ORAL | Status: DC
  Administered 2017-04-16: 300 mg via ORAL
  Filled 2017-04-16: qty 1

## 2017-04-16 MED ORDER — ONDANSETRON 4 MG PO TBDP: 4.0000 mg | ORAL_TABLET | Freq: Four times a day (QID) | ORAL | Status: DC | PRN

## 2017-04-16 MED ORDER — SODIUM CHLORIDE 0.9 % IV SOLN
2.0000 g | INTRAVENOUS | Status: DC
  Filled 2017-04-16: qty 20

## 2017-04-16 MED ORDER — ONDANSETRON HCL 4 MG/2ML IJ SOLN: 4.0000 mg | Freq: Four times a day (QID) | INTRAMUSCULAR | Status: DC | PRN

## 2017-04-16 MED ORDER — CELECOXIB 200 MG PO CAPS
200.0000 mg | ORAL_CAPSULE | Freq: Two times a day (BID) | ORAL | Status: DC
  Administered 2017-04-16: 200 mg via ORAL
  Filled 2017-04-16: qty 1

## 2017-04-16 MED ORDER — HYDROMORPHONE HCL 1 MG/ML IJ SOLN
INTRAMUSCULAR | Status: AC
  Filled 2017-04-16: qty 1

## 2017-04-16 MED ORDER — ALBUTEROL SULFATE (2.5 MG/3ML) 0.083% IN NEBU
INHALATION_SOLUTION | RESPIRATORY_TRACT | Status: AC
  Administered 2017-04-16: 2.5 mg via RESPIRATORY_TRACT
  Filled 2017-04-16: qty 3

## 2017-04-16 MED ORDER — KETOROLAC TROMETHAMINE 30 MG/ML IJ SOLN
30.0000 mg | Freq: Four times a day (QID) | INTRAMUSCULAR | Status: DC
  Administered 2017-04-16: 30 mg via INTRAVENOUS
  Filled 2017-04-16: qty 1

## 2017-04-16 MED ORDER — ENOXAPARIN SODIUM 40 MG/0.4ML ~~LOC~~ SOLN: 40.0000 mg | SUBCUTANEOUS | Status: DC

## 2017-04-16 MED ORDER — HYDROMORPHONE HCL 1 MG/ML IJ SOLN: 0.5000 mg | INTRAMUSCULAR | Status: DC | PRN

## 2017-04-16 MED ORDER — KETOROLAC TROMETHAMINE 30 MG/ML IJ SOLN: 30.0000 mg | Freq: Four times a day (QID) | INTRAMUSCULAR | Status: DC | PRN

## 2017-04-16 MED ORDER — DEXTROSE-NACL 5-0.9 % IV SOLN
INTRAVENOUS | Status: DC
  Administered 2017-04-16 (×2): via INTRAVENOUS
  Filled 2017-04-16: qty 1000

## 2017-04-16 MED ORDER — TRAMADOL HCL 50 MG PO TABS
50.0000 mg | ORAL_TABLET | Freq: Four times a day (QID) | ORAL | Status: DC
Start: 2017-04-16 — End: 2017-04-16
  Administered 2017-04-16: 50 mg via ORAL
  Filled 2017-04-16: qty 1

## 2017-04-16 NOTE — Progress Notes (Signed)
Transported patient from PACU to 5N07 without event.

## 2017-04-16 NOTE — Progress Notes (Signed)
[facility] Progress Note  1 Day Post-Op   Assessment/Plan  Acute, necrotic appendicitis - S/P lap appy, 03/12, Dr. Derrell Lollingamirez Hypoxia - pt was placed on CPAP overnight due to low O2 sats after extubation - 92% on RA this am - no history of OSA - chest xray this am showed left perihilar opacity that could reflect mild pneumonia - labs pending   FEN: carb modified VTE: SCD's, lovenox ID: Ceftriaxone & Flagyl 03/11>> Foley: none Follow up: [facility] 2 weeks  DISPO: labs pending, monitor O2 sats. Likely home tomorrow morning.    LOS: 0 days    Subjective: CC: abdominal soreness  Pt has not been out of bed yet or eaten. No flatus or BM. Family at beside. Pt does not have a hx of OSA but he snores. Pt is a Consulting civil engineerstudent at [employer] and his parents live 2.5hrs away. No PCP in the area. Pt was placed on CPAP last night due to low O2 sats after extubation. 92% on RA when I was in the room. No cough or illness prior to admission. No nausea, vomiting, SOB.   Objective: Vital signs in last 24 hours: Temp:  [98.1 F (36.7 C)-99.3 F (37.4 C)] 98.6 F (37 C) (03/12 0514) Pulse Rate:  [101-153] 116 (03/12 0514) Resp:  [14-43] 18 (03/12 0514) BP: (95-135)/(44-92) 133/79 (03/12 0514) SpO2:  [83 %-100 %] 96 % (03/12 0514) FiO2 (%):  [50 %] 50 % (03/12 0050) Weight:  [288 lb (130.6 kg)] 288 lb (130.6 kg) (03/11 1730)    Intake/Output from previous day: 03/11 0701 - 03/12 0700 In: 2600 [I.V.:1500; IV Piggyback:1100] Out: 435 [Urine:425; Blood:10] Intake/Output this shift: No intake/output data recorded.  PE: Gen:  Alert, NAD, pleasant, cooperative Card:  Mild tachycardia, no M/G/R heard Pulm:  Diminished breath sounds in right base, no W/R/R, rate and effort normal Abd: Soft, obese, not distended, hypoactive BS, incisions C/D/I, appropriately tender, no guarding Skin: no rashes noted, warm and dry   Anti-infectives: Anti-infectives (From admission, onward)   Start     Dose/Rate Route Frequency Ordered Stop   04/16/17 2100  cefTRIAXone (ROCEPHIN) 2 g in sodium chloride 0.9 % 100 mL IVPB     2 g 200 mL/hr over 30 Minutes Intravenous Every 24 hours 04/16/17 0022     04/16/17 0500  metroNIDAZOLE (FLAGYL) IVPB 500 mg     500 mg 100 mL/hr over 60 Minutes Intravenous Every 8 hours 04/16/17 0022     04/15/17 2015  cefTRIAXone (ROCEPHIN) 2 g in sodium chloride 0.9 % 100 mL IVPB     2 g 200 mL/hr over 30 Minutes Intravenous  Once 04/15/17 2012 04/15/17 2120   04/15/17 2015  metroNIDAZOLE (FLAGYL) IVPB 500 mg     500 mg 100 mL/hr over 60 Minutes Intravenous  Once 04/15/17 2012 04/15/17 2225      Lab Results:  Recent Labs    04/15/17 1731  WBC 18.7*  HGB 16.6  HCT 46.1  PLT 217   BMET Recent Labs    04/15/17 1731  NA 137  K 3.3*  CL 98*  CO2 25  GLUCOSE 126*  BUN 12  CREATININE 1.34*  CALCIUM 9.3   PT/INR No results for input(s): LABPROT, INR in the last 72 hours. CMP     Component Value Date/Time   NA 137 04/15/2017 1731   K 3.3 (L) 04/15/2017 1731   CL 98 (L) 04/15/2017 1731   CO2 25 04/15/2017 1731   GLUCOSE 126 (  H) 04/15/2017 1731   BUN 12 04/15/2017 1731   CREATININE 1.34 (H) 04/15/2017 1731   CALCIUM 9.3 04/15/2017 1731   PROT 8.0 04/15/2017 1731   ALBUMIN 4.4 04/15/2017 1731   AST 16 04/15/2017 1731   ALT 43 04/15/2017 1731   ALKPHOS 56 04/15/2017 1731   BILITOT 3.6 (H) 04/15/2017 1731   GFRNONAA >60 04/15/2017 1731   GFRAA >60 04/15/2017 1731   Lipase     Component Value Date/Time   LIPASE 40 04/15/2017 1731    Studies/Results: Ct Abdomen Pelvis W Contrast  Result Date: 04/15/2017 CLINICAL DATA:  22 year old male with history of nausea, vomiting and right-sided abdominal pain. EXAM: CT ABDOMEN AND PELVIS WITH CONTRAST TECHNIQUE: Multidetector CT imaging of the abdomen and pelvis was performed using the standard protocol following bolus administration of intravenous contrast. CONTRAST:  ISOVUE-300  IOPAMIDOL (ISOVUE-300) INJECTION 61% COMPARISON:  None. FINDINGS: Lower chest: Unremarkable. Hepatobiliary: Diffuse low attenuation throughout the hepatic parenchyma, compatible with hepatic steatosis. No suspicious cystic or solid hepatic lesions. No intra or extrahepatic biliary ductal dilatation. Gallbladder is normal in appearance. Pancreas: No pancreatic mass. No pancreatic ductal dilatation. No pancreatic or peripancreatic fluid or inflammatory changes. Spleen: Unremarkable. Adrenals/Urinary Tract: Bilateral kidneys and bilateral adrenal glands are normal in appearance. No hydroureteronephrosis. Urinary bladder is normal in appearance. Stomach/Bowel: Normal appearance of the stomach. No pathologic dilatation of small bowel or colon. Appendix is markedly dilated (18 mm in diameter) in demonstrates extensive surrounding inflammatory changes. There is a small amount of high attenuation in the proximal appendix (axial image 68 of series 3), but it is uncertain whether not this is some oral contrast material or a small appendicoliths. Oral contrast material is present in the cecum and colon. Appendix: Location: Medial and inferior to the cecum Diameter: 18 mm Appendicolith: Possibly Mucosal hyper-enhancement: Yes Extraluminal gas: None Periappendiceal collection: Phlegmon but no abscess Vascular/Lymphatic: No significant atherosclerotic disease, aneurysm or dissection noted in the abdominal or pelvic vasculature. No lymphadenopathy noted in the abdomen or pelvis. Reproductive: Prostate gland and seminal vesicles are unremarkable in appearance. Other: No significant volume of ascites.  No pneumoperitoneum. Musculoskeletal: There are no aggressive appearing lytic or blastic lesions noted in the visualized portions of the skeleton. IMPRESSION: 1. Findings are compatible with severe acute appendicitis. Periappendiceal phlegmon noted, without definite periappendiceal abscess or signs of frank perforation at this time.  Surgical consultation is strongly recommended. 2. Hepatic steatosis. Critical Value/emergent results were called by telephone at the time of interpretation on 04/15/2017 at 8:11 pm to Dr. Jaynie Crumble, who verbally acknowledged these results. Electronically Signed   By: Trudie Reed M.D.   On: 04/15/2017 20:21   Dg Chest Port 1 View  Result Date: 04/16/2017 CLINICAL DATA:  Acute onset of shortness of breath. Decreased O2 saturation. EXAM: PORTABLE CHEST 1 VIEW COMPARISON:  None. FINDINGS: The lungs are well-aerated. Left perihilar opacity could reflect mild pneumonia. There is no evidence of pleural effusion or pneumothorax. The cardiomediastinal silhouette is within normal limits. No acute osseous abnormalities are seen. IMPRESSION: Left perihilar opacity could reflect mild pneumonia. Electronically Signed   By: Roanna Raider M.D.   On: 04/16/2017 00:59      Jerre Simon , [facility] 04/16/2017, 9:03 AM  Pager: [phone] Mon-Wed, Friday 7:00am-4:30pm Thurs 7am-11:30am  Consults: [phone]

## 2017-04-16 NOTE — Progress Notes (Signed)
William Flynn to be D/C'd Home per MD order.  Discussed prescriptions and follow up appointments with the patient. Prescriptions given to patient, medication list explained in detail. Pt verbalized understanding.  Allergies as of 04/16/2017   No Known Allergies     Medication List    STOP taking these medications   neomycin-polymyxin-hydrocortisone OTIC solution Commonly known as:  CORTISPORIN     TAKE these medications   amoxicillin-clavulanate 875-125 MG tablet Commonly known as:  AUGMENTIN Take 1 tablet by mouth every 12 (twelve) hours.   bismuth subsalicylate 262 MG/15ML suspension Commonly known as:  PEPTO BISMOL Take 30 mLs by mouth every 6 (six) hours as needed for indigestion or diarrhea or loose stools.   ibuprofen 200 MG tablet Commonly known as:  ADVIL,MOTRIN Take 200 mg by mouth every 6 (six) hours as needed for mild pain.   traMADol 50 MG tablet Commonly known as:  ULTRAM Take 1 tablet (50 mg total) by mouth every 6 (six) hours as needed.       Vitals:   04/16/17 0514 04/16/17 1410  BP: 133/79 113/60  Pulse: (!) 116 (!) 102  Resp: 18 (!) 23  Temp: 98.6 F (37 C) 98.2 F (36.8 C)  SpO2: 96% 94%    Skin clean, dry and intact without evidence of skin break down, no evidence of skin tears noted. 3 surgical incisions on abdomen, clean, dry, intact, and open to air. IV catheter discontinued intact. Site without signs and symptoms of complications. Dressing and pressure applied. Pt denies pain at this time. No complaints noted.  An After Visit Summary and prescriptions were printed and given to the patient. Patient escorted via WC, and D/C home via private auto.  GrenadaBrittany Vallorie Niccoli RN

## 2017-04-16 NOTE — Anesthesia Postprocedure Evaluation (Signed)
Anesthesia Post Note  Patient: William Flynn  Procedure(s) Performed: APPENDECTOMY LAPAROSCOPIC (N/A )     Patient location during evaluation: PACU Anesthesia Type: General Level of consciousness: awake and alert Pain management: pain level controlled Vital Signs Assessment: post-procedure vital signs reviewed and stable Respiratory status: spontaneous breathing, nonlabored ventilation, respiratory function stable and patient connected to nasal cannula oxygen Cardiovascular status: blood pressure returned to baseline and stable Postop Assessment: no apparent nausea or vomiting Anesthetic complications: no    Last Vitals:  Vitals:   04/16/17 0300 04/16/17 0514  BP:  133/79  Pulse: (!) 118 (!) 116  Resp: 18 18  Temp:  37 C  SpO2: 95% 96%    Last Pain:  Vitals:   04/16/17 0514  TempSrc: Oral  PainSc:                  Kennieth RadFitzgerald, Haven Foss E

## 2017-04-16 NOTE — Discharge Summary (Signed)
[facility]/Trauma Discharge Summary   Patient ID: William GeorgeZach Flynn MRN: [id number] DOB/AGE: Oct 05, 1995 22 y.o.  Admit date: 04/15/2017 Discharge date: 04/16/2017  Admitting Diagnosis: appendicitis  Discharge Diagnosis Patient Active Problem List   Diagnosis Date Noted  . S/P laparoscopic appendectomy 04/16/2017    Consultants none  Imaging: Ct Abdomen Pelvis W Contrast  Result Date: 04/15/2017 CLINICAL DATA:  22 year old male with history of nausea, vomiting and right-sided abdominal pain. EXAM: CT ABDOMEN AND PELVIS WITH CONTRAST TECHNIQUE: Multidetector CT imaging of the abdomen and pelvis was performed using the standard protocol following bolus administration of intravenous contrast. CONTRAST:  100mL ISOVUE-300 IOPAMIDOL (ISOVUE-300) INJECTION 61% COMPARISON:  None. FINDINGS: Lower chest: Unremarkable. Hepatobiliary: Diffuse low attenuation throughout the hepatic parenchyma, compatible with hepatic steatosis. No suspicious cystic or solid hepatic lesions. No intra or extrahepatic biliary ductal dilatation. Gallbladder is normal in appearance. Pancreas: No pancreatic mass. No pancreatic ductal dilatation. No pancreatic or peripancreatic fluid or inflammatory changes. Spleen: Unremarkable. Adrenals/Urinary Tract: Bilateral kidneys and bilateral adrenal glands are normal in appearance. No hydroureteronephrosis. Urinary bladder is normal in appearance. Stomach/Bowel: Normal appearance of the stomach. No pathologic dilatation of small bowel or colon. Appendix is markedly dilated (18 mm in diameter) in demonstrates extensive surrounding inflammatory changes. There is a small amount of high attenuation in the proximal appendix (axial image 68 of series 3), but it is uncertain whether not this is some oral contrast material or a small appendicoliths. Oral contrast material is present in the cecum and colon. Appendix: Location: Medial and inferior to the cecum Diameter: 18 mm Appendicolith:  Possibly Mucosal hyper-enhancement: Yes Extraluminal gas: None Periappendiceal collection: Phlegmon but no abscess Vascular/Lymphatic: No significant atherosclerotic disease, aneurysm or dissection noted in the abdominal or pelvic vasculature. No lymphadenopathy noted in the abdomen or pelvis. Reproductive: Prostate gland and seminal vesicles are unremarkable in appearance. Other: No significant volume of ascites.  No pneumoperitoneum. Musculoskeletal: There are no aggressive appearing lytic or blastic lesions noted in the visualized portions of the skeleton. IMPRESSION: 1. Findings are compatible with severe acute appendicitis. Periappendiceal phlegmon noted, without definite periappendiceal abscess or signs of frank perforation at this time. Surgical consultation is strongly recommended. 2. Hepatic steatosis. Critical Value/emergent results were called by telephone at the time of interpretation on 04/15/2017 at 8:11 pm to Dr. Jaynie CrumbleATYANA KIRICHENKO, who verbally acknowledged these results. Electronically Signed   By: Trudie Reedaniel  Entrikin M.D.   On: 04/15/2017 20:21   Dg Chest Port 1 View  Result Date: 04/16/2017 CLINICAL DATA:  Acute onset of shortness of breath. Decreased O2 saturation. EXAM: PORTABLE CHEST 1 VIEW COMPARISON:  None. FINDINGS: The lungs are well-aerated. Left perihilar opacity could reflect mild pneumonia. There is no evidence of pleural effusion or pneumothorax. The cardiomediastinal silhouette is within normal limits. No acute osseous abnormalities are seen. IMPRESSION: Left perihilar opacity could reflect mild pneumonia. Electronically Signed   By: Roanna RaiderJeffery  Chang M.D.   On: 04/16/2017 00:59    Procedures Dr. Harden Moamierz (04/16/17) -  Laparoscopic Appendectomy   Hospital Course:  Pt is a 22 yo male who [facility] with [facility].  Workup showed appendicitis.  Patient was admitted and underwent procedure listed above.  Tolerated procedure well and was transferred to the floor.  Pt had  issues with hypoxia after extubation. He was placed on CPAP and O2 sats were stable and >94% prior to discharge. Diet was advanced as tolerated. Pt had AKI and elevated glucose. Pt was instructed to f/u PCP regarding his  glucose and check repeat creatinine. On POD#1, the patient was voiding well, tolerating diet, ambulating well, pain well controlled, vital signs stable, incisions c/d/i and felt stable for discharge home.  Patient will follow up in our office in 2 weeks and knows to call with questions or concerns.  He will call to confirm appointment date/time.    Patient was discharged in good condition.  The [place] Substance controlled database was reviewed prior to prescribing narcotic pain medication to this patient.  Physical Exam: Gen:  Alert, NAD, pleasant, cooperative Card:  Mild tachycardia, no M/G/R heard Pulm:  Diminished breath sounds in right base, no W/R/R, rate and effort normal Abd: Soft, obese, not distended, hypoactive BS, incisions C/D/I, appropriately tender, no guarding Skin: no rashes noted, warm and dry   Allergies as of 04/16/2017   No Known Allergies     Medication List    STOP taking these medications   neomycin-polymyxin-hydrocortisone OTIC solution Commonly known as:  CORTISPORIN     TAKE these medications   amoxicillin-clavulanate 875-125 MG tablet Commonly known as:  AUGMENTIN Take 1 tablet by mouth every 12 (twelve) hours.   bismuth subsalicylate 262 MG/15ML suspension Commonly known as:  PEPTO BISMOL Take 30 mLs by mouth every 6 (six) hours as needed for indigestion or diarrhea or loose stools.   ibuprofen 200 MG tablet Commonly known as:  ADVIL,MOTRIN Take 200 mg by mouth every 6 (six) hours as needed for mild pain.   traMADol 50 MG tablet Commonly known as:  ULTRAM Take 1 tablet (50 mg total) by mouth every 6 (six) hours as needed.        Follow-up Information    [facility], [place]. Schedule an appointment as soon as  possible for a visit in 2 week(s).   Specialty:  General Surgery Contact information: [address] [place] [address] [phone]       [facility] Go on 04/26/2017.   Why:  for lab work Contact information: [phone] [address] [place], [place]          Signed: Jerre Simon [facility] 04/16/2017, 3:29 PM Pager: [phone] Consults: [phone] Mon-Fri 7:00 am-4:30 pm Sat-Sun 7:00 am-11:30 am

## 2017-04-16 NOTE — Progress Notes (Signed)
Placed patient on CPAP for HS, auto settings max 20, min 6 with 4 lpm O2 bleed in. Tolerating well at this time.

## 2017-04-16 NOTE — Transfer of Care (Signed)
Immediate Anesthesia Transfer of Care Note  Patient: William GeorgeZach Flynn  Procedure(s) Performed: APPENDECTOMY LAPAROSCOPIC (N/A )  Patient Location: PACU  Anesthesia Type:General  Level of Consciousness: awake, oriented and drowsy  Airway & Oxygen Therapy: Patient connected to face mask oxygen  Post-op Assessment: Report given to RN and Post -op Vital signs reviewed and stable  Post vital signs: Reviewed and stable  Last Vitals:  Vitals:   04/15/17 2200 04/16/17 0005  BP: 126/77 (!) 120/53  Pulse: (!) 105 (!) 152  Resp: 16 (!) 41  Temp:  36.7 C  SpO2: 97%     Last Pain:  Vitals:   04/15/17 2217  TempSrc:   PainSc: 8          Complications: No apparent anesthesia complications

## 2017-04-16 NOTE — Op Note (Signed)
04/15/2017  12:01 AM  PATIENT:  William Flynn  22 y.o. male  PRE-OPERATIVE DIAGNOSIS:  acute appendicitis  POST-OPERATIVE DIAGNOSIS:  Acute, necrotic appendicitis  PROCEDURE:  Procedure(s): APPENDECTOMY LAPAROSCOPIC (N/A)  SURGEON:  Surgeon(s) and Role:    Axel Filler* Jerelle Virden, MD - Primary  ANESTHESIA:   local and general  EBL:  minimal   BLOOD ADMINISTERED:none  DRAINS: none   LOCAL MEDICATIONS USED:  MARCAINE     SPECIMEN:  Source of Specimen:  appendix  DISPOSITION OF SPECIMEN:  PATHOLOGY  COUNTS:  YES  TOURNIQUET:  * No tourniquets in log *  DICTATION: .Dragon Dictation Complications: none  Counts: reported as correct x 2  Findings:  The patient had a acutely inflamed, necrotic appendix  Specimen: Appendix  Indications for procedure:  The patient is a 22 year old male with a history of periumbilical pain localized in the right lower quadrant patient had a CT scan which revealed signs consistent with acute appendicitis the patient back in for laparoscopic appendectomy.  Details of the procedure:The patient was taken back to the operating room. The patient was placed in supine position with bilateral SCDs in place.  A foley catheter was place. The patient was prepped and draped in the usual sterile fashion.  After appropriate anitbiotics were confirmed, a time-out was confirmed and all facts were verified.    A pneumoperitoneum of 14 mmHg was obtained via a Veress needle technique in the left lower quadrant quadrant.  A 5 mm trocar and 5 mm camera then placed intra-abdominally there is no injury to any intra-abdominal organs a 10 mm infraumbilical port was placed and direct visualization as was a 5 mm port in the suprapubic area.   The appendix was identified and seen to be necrotic, however was not perforated.  The appendix was cleaned down to the appendiceal base. The mesoappendix was then incised and the appendiceal artery was cauterized.  The the appendiceal base  was clean.  At this time an Endoloop was placed proximallyx2 and one distally and the appendix was transected between these 2. A retrieval bag was then placed into the abdomen and the specimen placed in the bag. The appendiceal stump was cauterized. We evacuate the fluid from the pelvis until the effluent was clear.  The appendix and retrieval  bag was then retrieved via the supraumbilical port. #1 Vicryl was used to reapproximate the fascia at the umbilical port site x2. The skin was reapproximated all port sites 3-0 Monocryl subcuticular fashion. The skin was dressed with Dermabond.  The patient had the foley removed. The patient was awakened from general anesthesia was taken to recovery room in stable condition.      PLAN OF CARE: Admit for overnight observation  PATIENT DISPOSITION:  PACU - hemodynamically stable.   Delay start of Pharmacological VTE agent (>24hrs) due to surgical blood loss or risk of bleeding: not applicable

## 2017-04-16 NOTE — Discharge Instructions (Signed)
[facility] ______[facility] °LAPAROSCOPIC SURGERY: POST OP INSTRUCTIONS °Always review your discharge instruction sheet given to you by the facility where your surgery was performed. °IF YOU HAVE DISABILITY OR FAMILY LEAVE FORMS, YOU MUST BRING THEM TO THE OFFICE FOR PROCESSING.   °DO NOT GIVE THEM TO YOUR DOCTOR. ° °1. A prescription for pain medication may be given to you upon discharge.  Take your pain medication as prescribed, if needed.  If narcotic pain medicine is not needed, then you may take acetaminophen (Tylenol) or ibuprofen (Advil) as needed. °2. Take your usually prescribed medications unless otherwise directed. °3. If you need a refill on your pain medication, please contact your pharmacy.  They will contact our office to request authorization. Prescriptions will not be filled after 5pm or on week-ends. °4. You should follow a light diet the first few days after arrival home, such as soup and crackers, etc.  Be sure to include lots of fluids daily. °5. Most patients will experience some swelling and bruising in the area of the incisions.  Ice packs will help.  Swelling and bruising can take several days to resolve.  °6. It is common to experience some constipation if taking pain medication after surgery.  Increasing fluid intake and taking a stool softener (such as Colace) will usually help or prevent this problem from occurring.  A mild laxative (Milk of Magnesia or Miralax) should be taken according to package instructions if there are no bowel movements after 48 hours. °7. Unless discharge instructions indicate otherwise, you may remove your bandages 24-48 hours after surgery, and you may shower at that time.  You may have steri-strips (small skin tapes) in place directly over the incision.  These strips should be left on the skin for 7-10 days.  If your surgeon used skin glue on the incision, you may shower in 24 hours.  The glue will flake off over the next 2-3 weeks.  Any sutures or  staples will be removed at the office during your follow-up visit. °8. ACTIVITIES:  You may resume regular (light) daily activities beginning the next day--such as daily self-care, walking, climbing stairs--gradually increasing activities as tolerated.  You may have sexual intercourse when it is comfortable.  Refrain from any heavy lifting or straining until approved by your doctor. °a. You may drive when you are no longer taking prescription pain medication, you can comfortably wear a seatbelt, and you can safely maneuver your car and apply brakes. °b. RETURN TO WORK:  __________________________________________________________ °9. You should see your doctor in the office for a follow-up appointment approximately 2-3 weeks after your surgery.  Make sure that you call for this appointment within a day or two after you arrive home to insure a convenient appointment time. °10. OTHER INSTRUCTIONS: __________________________________________________________________________________________________________________________ __________________________________________________________________________________________________________________________ °WHEN TO CALL YOUR DOCTOR: °1. Fever over 101.0 °2. Inability to urinate °3. Continued bleeding from incision. °4. Increased pain, redness, or drainage from the incision. °5. Increasing abdominal pain ° °The clinic staff is available to answer your questions during regular business hours.  Please don’t hesitate to call and ask to speak to one of the nurses for clinical concerns.  If you have a medical emergency, go to the nearest emergency room or call 911.  A surgeon from [facility] is always on call at the hospital. °[address], [address], [place], [place]  [address] ? [address] [address], [place],    [address] °[phone] ? [phone] ? FAX [phone] °Web site:   [email or web address]  Follow up with your home primary care provider or one in  the area to have your blood pressure, serum creatinine (kidney function) and blood sugars checked within 2 weeks.

## 2017-04-16 NOTE — Anesthesia Procedure Notes (Signed)
Procedure Name: Intubation Date/Time: 04/15/2017 11:08 PM Performed by: Molli HazardGordon, Sonam Huelsmann M, CRNA Pre-anesthesia Checklist: Patient identified, Emergency Drugs available, Suction available and Patient being monitored Patient Re-evaluated:Patient Re-evaluated prior to induction Oxygen Delivery Method: Circle system utilized Preoxygenation: Pre-oxygenation with 100% oxygen Induction Type: IV induction, Rapid sequence and Cricoid Pressure applied Laryngoscope Size: Miller and 2 Grade View: Grade I Tube type: Oral Tube size: 7.5 mm Number of attempts: 1 Airway Equipment and Method: Stylet Placement Confirmation: ETT inserted through vocal cords under direct vision,  positive ETCO2 and breath sounds checked- equal and bilateral Secured at: 23 cm Tube secured with: Tape Dental Injury: Teeth and Oropharynx as per pre-operative assessment

## 2018-07-14 IMAGING — CT CT ABD-PELV W/ CM
2 of 4 series · 16 of 46 positions shown, 18 images · IV contrast (APPLIED)
Comparison: None.

CLINICAL DATA: 21-year-old male with history of nausea, vomiting
and right-sided abdominal pain.

EXAM:
CT ABDOMEN AND PELVIS WITH CONTRAST
TECHNIQUE: Multidetector CT imaging of the abdomen and pelvis was performed
using the standard protocol following bolus administration of
intravenous contrast.
CONTRAST:  100mL YONQXH-011 IOPAMIDOL (YONQXH-011) INJECTION 61%

[Series 3: abdomen 5.0 · axial · 0.98mm/px · z∈[+698,+1138]mm · 13 of 100 slices shown, 15 images]
[im 6/100  soft-tissue]
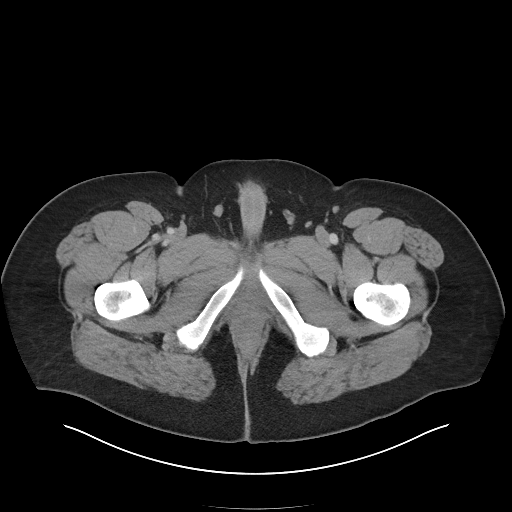
[im 6/100  bone]
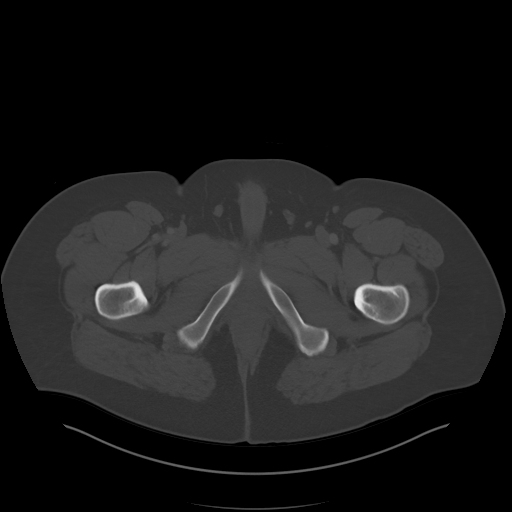
[im 12/100  soft-tissue]
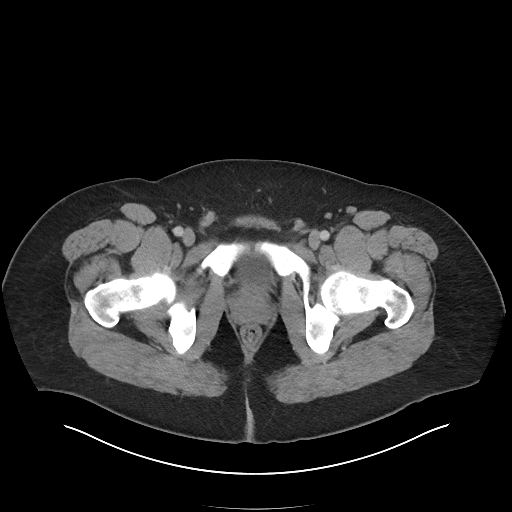
[im 23/100  soft-tissue]
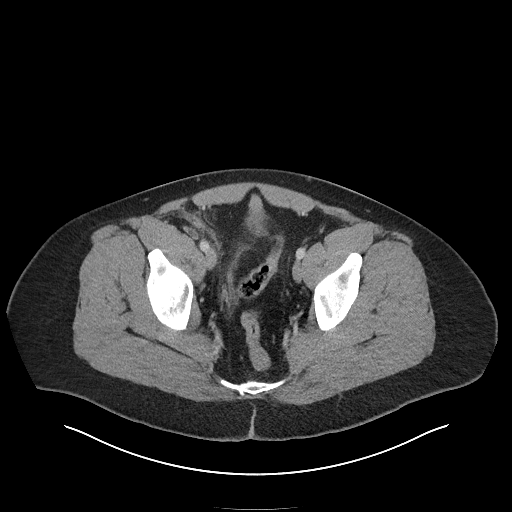
[im 28/100  soft-tissue]
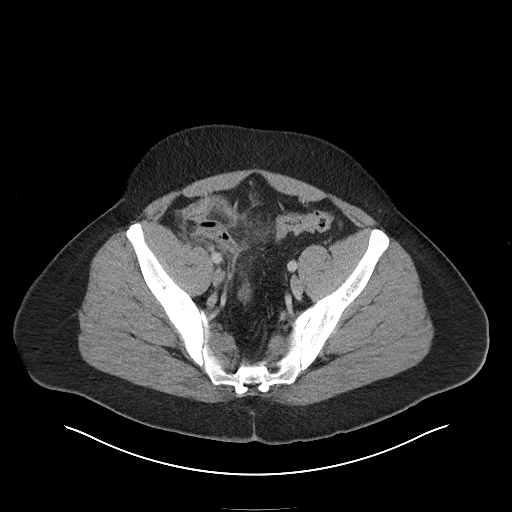
[im 34/100  soft-tissue]
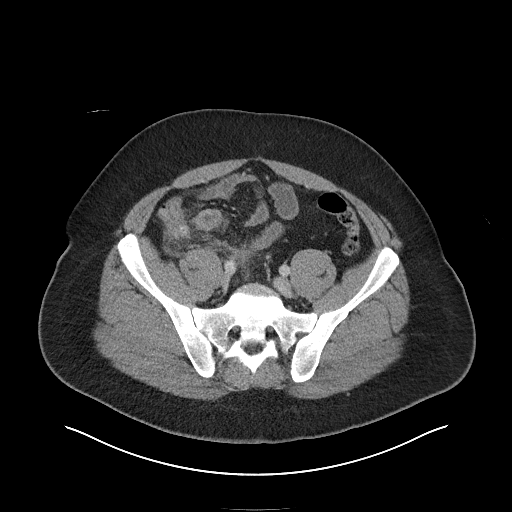
[im 45/100  soft-tissue]
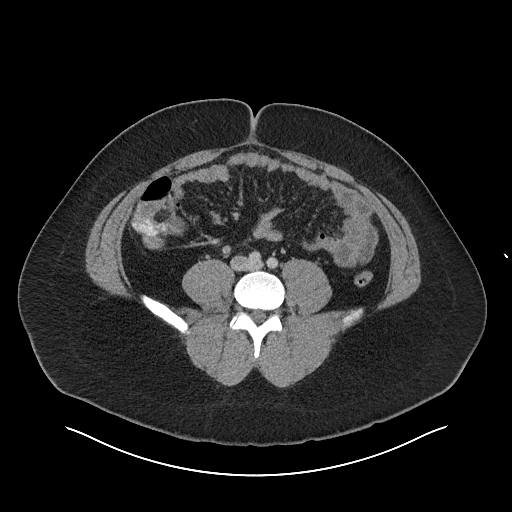
[im 50/100  soft-tissue]
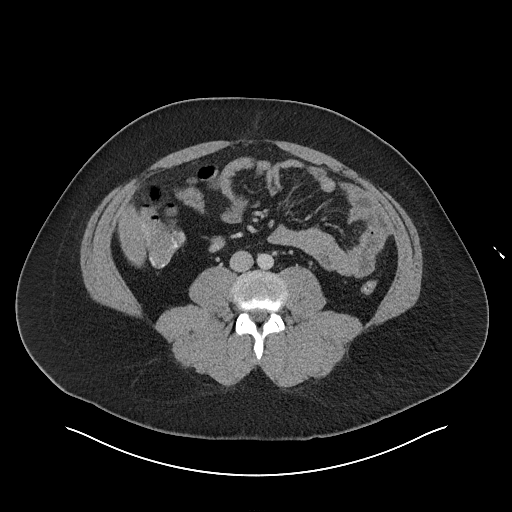
[im 56/100  soft-tissue]
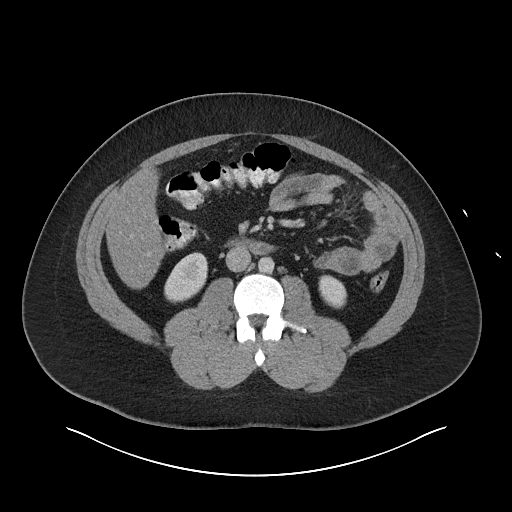
[im 67/100  soft-tissue]
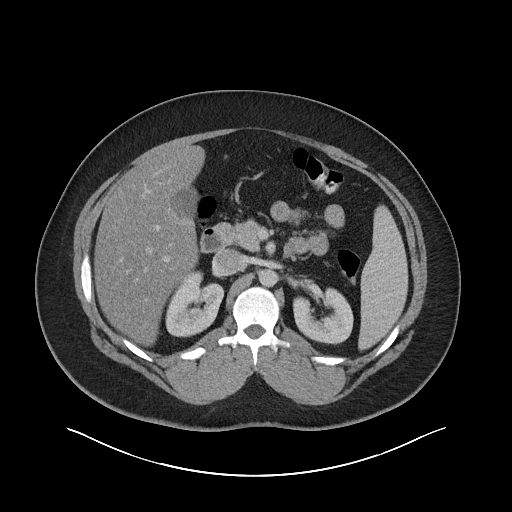
[im 67/100  bone]
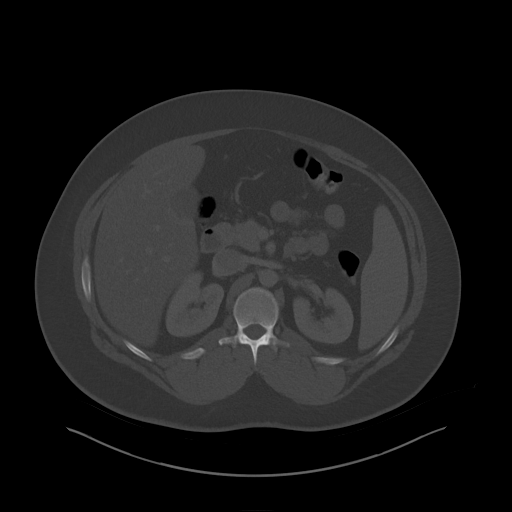
[im 72/100  soft-tissue]
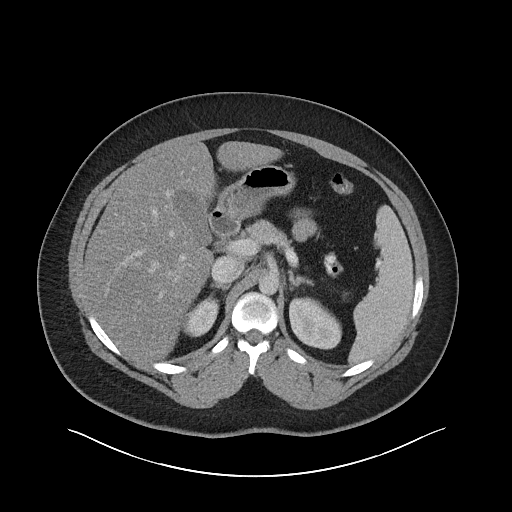
[im 78/100  soft-tissue]
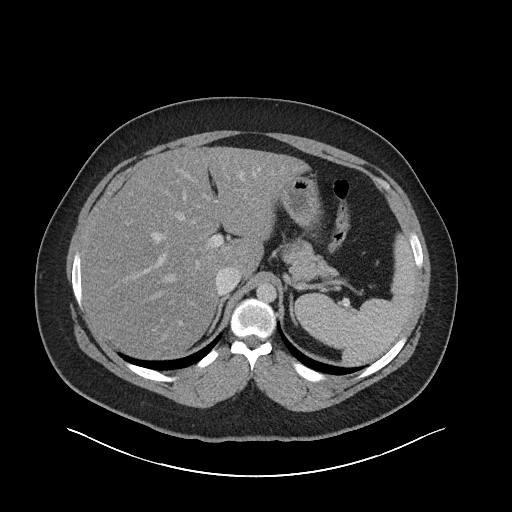
[im 89/100  soft-tissue]
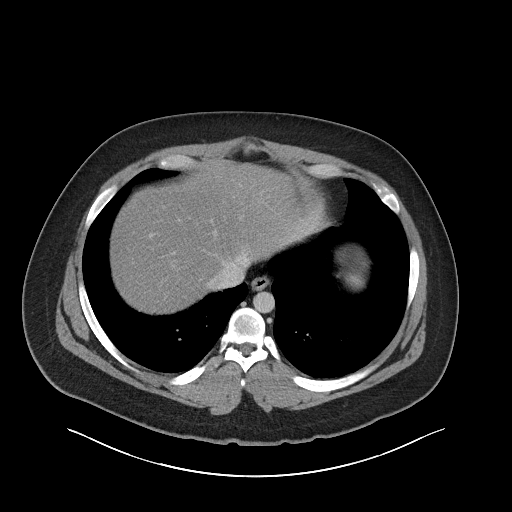
[im 94/100  soft-tissue]
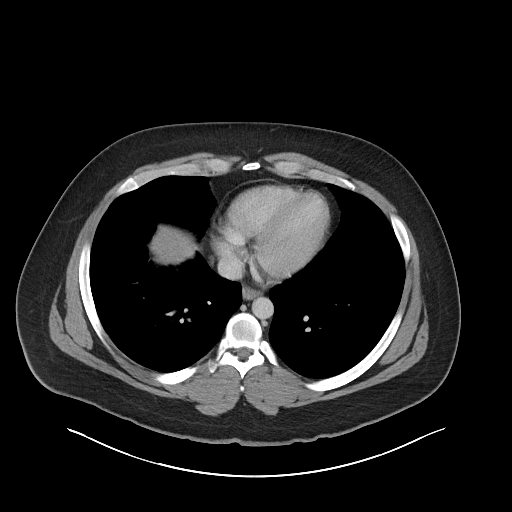

[Series 6: abdomen 3.0 mpr cor · coronal · 0.86mm/px · 3 of 105 slices shown]
[im 35/105  soft-tissue]
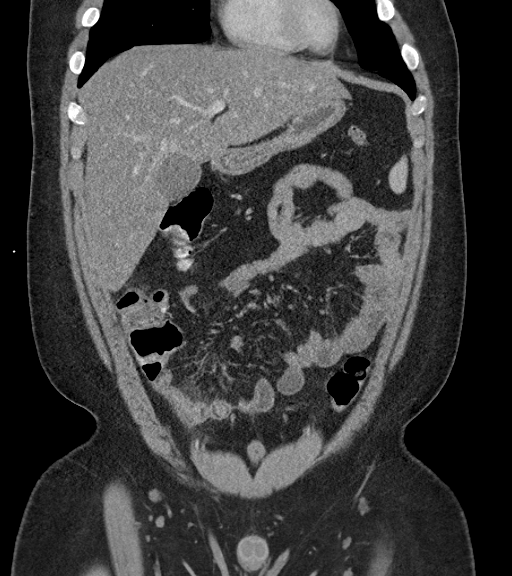
[im 47/105  soft-tissue]
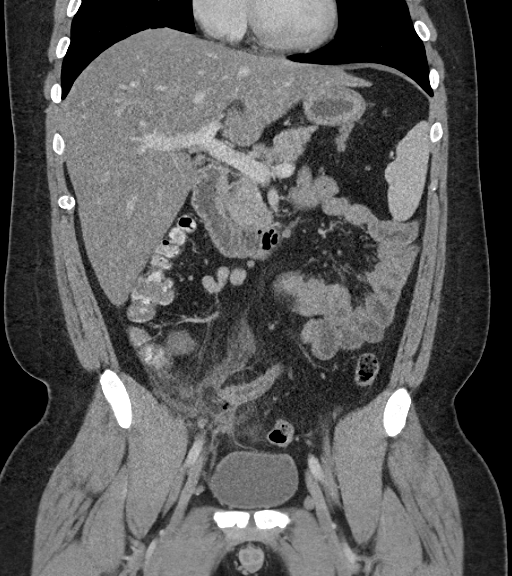
[im 58/105  soft-tissue]
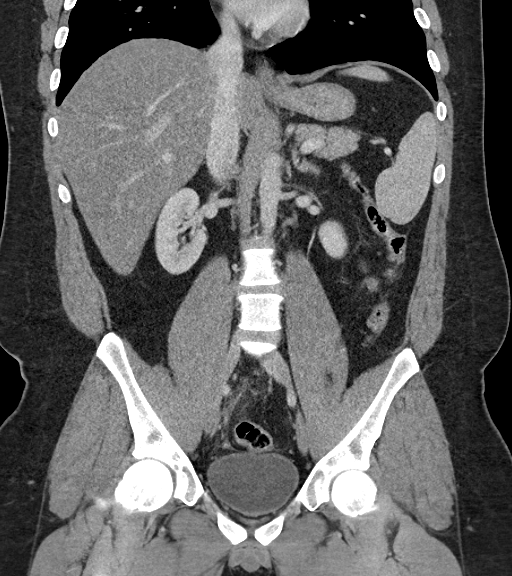

[16 of 46 positions shown; findings below may reference images not displayed]

FINDINGS: Lower chest: Unremarkable.

Hepatobiliary: Diffuse low attenuation throughout the hepatic
parenchyma, compatible with hepatic steatosis. No suspicious cystic
or solid hepatic lesions. No intra or extrahepatic biliary ductal
dilatation. Gallbladder is normal in appearance.

Pancreas: No pancreatic mass. No pancreatic ductal dilatation. No
pancreatic or peripancreatic fluid or inflammatory changes.

Spleen: Unremarkable.

Adrenals/Urinary Tract: Bilateral kidneys and bilateral adrenal
glands are normal in appearance. No hydroureteronephrosis. Urinary
bladder is normal in appearance.

Stomach/Bowel: Normal appearance of the stomach. No pathologic
dilatation of small bowel or colon. Appendix is markedly dilated (18
mm in diameter) in demonstrates extensive surrounding inflammatory
changes. There is a small amount of high attenuation in the proximal
appendix (axial image 68 of series 3), but it is uncertain whether
not this is some oral contrast material or a small appendicoliths.
Oral contrast material is present in the cecum and colon.

Appendix: Location: Medial and inferior to the cecum

Diameter: 18 mm

Appendicolith: Possibly

Mucosal hyper-enhancement: Yes

Extraluminal gas: None

Periappendiceal collection: Phlegmon but no abscess

Vascular/Lymphatic: No significant atherosclerotic disease, aneurysm
or dissection noted in the abdominal or pelvic vasculature. No
lymphadenopathy noted in the abdomen or pelvis.

Reproductive: Prostate gland and seminal vesicles are unremarkable
in appearance.

Other: No significant volume of ascites.  No pneumoperitoneum.

Musculoskeletal: There are no aggressive appearing lytic or blastic
lesions noted in the visualized portions of the skeleton.
IMPRESSION: 1. Findings are compatible with severe acute appendicitis.
Periappendiceal phlegmon noted, without definite periappendiceal
abscess or signs of frank perforation at this time. Surgical
consultation is strongly recommended.
2. Hepatic steatosis.

Critical Value/emergent results were called by telephone at the time
of interpretation on 04/15/2017 at [DATE] to Dr. JESSLIN SAMUDRA,
who verbally acknowledged these results.
